# Patient Record
Sex: Female | Born: 1955 | Race: White | Hispanic: No | Marital: Married | Smoking: Never smoker
Health system: Southern US, Community
[De-identification: ages and names within clinical notes are randomized; demographics above are authoritative.]

## PROBLEM LIST (undated history)

## (undated) DIAGNOSIS — 1 ERRONEOUS ENCOUNTER ICD10: Secondary | ICD-10-CM

## (undated) HISTORY — PX: BREAST EXCISIONAL BIOPSY: SUR124

## (undated) HISTORY — PX: BREAST CYST ASPIRATION: SHX578

---

## 2010-08-17 ENCOUNTER — Ambulatory Visit: Payer: Self-pay | Admitting: Family Medicine

## 2010-08-25 ENCOUNTER — Ambulatory Visit: Payer: Self-pay | Admitting: Family Medicine

## 2012-01-21 ENCOUNTER — Emergency Department: Payer: Self-pay | Admitting: Emergency Medicine

## 2012-07-24 ENCOUNTER — Ambulatory Visit: Payer: Self-pay | Admitting: Family Medicine

## 2012-07-26 ENCOUNTER — Ambulatory Visit: Payer: Self-pay | Admitting: Family Medicine

## 2014-03-02 ENCOUNTER — Ambulatory Visit: Payer: Self-pay | Admitting: Internal Medicine

## 2014-03-02 LAB — URINALYSIS, COMPLETE
Bilirubin,UR: NEGATIVE
Glucose,UR: NEGATIVE
Ketone: NEGATIVE
NITRITE: NEGATIVE
PROTEIN: NEGATIVE
Ph: 6.5 (ref 5.0–8.0)
Specific Gravity: 1.005 (ref 1.000–1.030)
WBC UR: >30 /HPF (ref 0–5)

## 2014-03-04 LAB — URINE CULTURE

## 2014-09-19 DIAGNOSIS — L219 Seborrheic dermatitis, unspecified: Secondary | ICD-10-CM | POA: Insufficient documentation

## 2014-09-19 DIAGNOSIS — G5603 Carpal tunnel syndrome, bilateral upper limbs: Secondary | ICD-10-CM | POA: Insufficient documentation

## 2014-10-06 ENCOUNTER — Ambulatory Visit
Admit: 2014-10-06 | Disposition: A | Discharge status: Home / Self Care | Payer: Self-pay | Attending: Family Medicine | Admitting: Family Medicine

## 2014-10-29 DIAGNOSIS — I10 Essential (primary) hypertension: Secondary | ICD-10-CM | POA: Insufficient documentation

## 2015-12-06 ENCOUNTER — Emergency Department
Admission: EM | Admit: 2015-12-06 | Discharge: 2015-12-06 | Disposition: A | Discharge status: Home / Self Care | Payer: Managed Care, Other (non HMO) | Attending: Emergency Medicine | Admitting: Emergency Medicine

## 2015-12-06 ENCOUNTER — Emergency Department: Payer: Managed Care, Other (non HMO)

## 2015-12-06 DIAGNOSIS — J209 Acute bronchitis, unspecified: Secondary | ICD-10-CM | POA: Insufficient documentation

## 2015-12-06 DIAGNOSIS — R05 Cough: Secondary | ICD-10-CM | POA: Diagnosis present

## 2015-12-06 LAB — BASIC METABOLIC PANEL
Anion gap: 9 (ref 5–15)
BUN: 13 mg/dL (ref 6–20)
CALCIUM: 9.7 mg/dL (ref 8.9–10.3)
CHLORIDE: 105 mmol/L (ref 101–111)
CO2: 24 mmol/L (ref 22–32)
CREATININE: 0.86 mg/dL (ref 0.44–1.00)
GFR calc non Af Amer: >60 mL/min (ref >60)
GLUCOSE: 109 mg/dL — AB (ref 65–99)
Potassium: 3.7 mmol/L (ref 3.5–5.1)
Sodium: 138 mmol/L (ref 135–145)

## 2015-12-06 LAB — CBC
HCT: 37.8 % (ref 35.0–47.0)
Hemoglobin: 12.8 g/dL (ref 12.0–16.0)
MCH: 29.6 pg (ref 26.0–34.0)
MCHC: 33.8 g/dL (ref 32.0–36.0)
MCV: 87.5 fL (ref 80.0–100.0)
PLATELETS: 239 10*3/uL (ref 150–440)
RBC: 4.32 MIL/uL (ref 3.80–5.20)
RDW: 13.9 % (ref 11.5–14.5)
WBC: 8.5 10*3/uL (ref 3.6–11.0)

## 2015-12-06 LAB — TROPONIN I

## 2015-12-06 MED ORDER — ALBUTEROL SULFATE HFA 108 (90 BASE) MCG/ACT IN AERS: 2.0000 | INHALATION_SPRAY | Freq: Four times a day (QID) | RESPIRATORY_TRACT | Status: DC | PRN

## 2015-12-06 MED ORDER — BENZONATATE 100 MG PO CAPS: 100.0000 mg | ORAL_CAPSULE | Freq: Three times a day (TID) | ORAL | Status: AC | PRN

## 2015-12-06 NOTE — ED Provider Notes (Signed)
Time Seen: Approximately 1800 I have reviewed the triage notes  Chief Complaint: Cough and Chest Pain   History of Present Illness: Mariah Ali is a 60 y.o. female who presents with a one-week history of some intermittent nasal congestion, productive cough with some clear to occasional dark tinged sputum and some feelings of chest tightness. [place] and she isa symptoms seem to be worse whenever the central area is turned on. She states that no particular chest pain, she just feels like occasionally she can't quite get a full breath. She does smoke but otherwise denies any pulmonary emboli risk factors. She denies any leg pain or swelling. She denies any nausea, vomiting, diaphoresis. She denies any hemoptysis or wheezing at home.   No past medical history on file.  There are no active problems to display for this patient.   No past surgical history on file.  No past surgical history on file.  Current Outpatient Rx  Name  Route  Sig  Dispense  Refill  . albuterol (PROVENTIL HFA;VENTOLIN HFA) 108 (90 Base) MCG/ACT inhaler   Inhalation   Inhale 2 puffs into the lungs every 6 (six) hours as needed for wheezing or shortness of breath.   1 Inhaler   2   . benzonatate (TESSALON PERLES) 100 MG capsule   Oral   Take 1 capsule (100 mg total) by mouth 3 (three) times daily as needed for cough.   30 capsule   0     Allergies:  Codeine  Family History: No family history on file.  Social History: Social History  Substance Use Topics  . Smoking status: Not on file  . Smokeless tobacco: Not on file  . Alcohol Use: Not on file     Review of Systems:   10 point review of systems was performed and was otherwise negative:  Constitutional: No fever Eyes: No visual disturbances ENT: No sore throat, ear pain Cardiac: No  current chest pain Respiratory: No current shortness of breath, wheezing, or stridor Abdomen: No abdominal pain,  no vomiting, No diarrhea Endocrine: No weight loss, No night sweats Extremities: No peripheral edema, cyanosis Skin: No rashes, easy bruising Neurologic: No focal weakness, trouble with speech or swollowing Urologic: No dysuria, Hematuria, or urinary frequency   Physical Exam:  ED Triage Vitals  Enc Vitals Group     BP 12/06/15 1705 161/58 mmHg     Pulse Rate 12/06/15 1705 97     Resp 12/06/15 1705 20     Temp 12/06/15 1705 98 F (36.7 C)     Temp Source 12/06/15 1705 Oral     SpO2 12/06/15 1705 99 %     Weight 12/06/15 1705 134 lb (60.782 kg)     Height 12/06/15 1705  (1.676 m)     Head Cir --      Peak Flow --      Pain Score --      Pain Loc --      Pain Edu? --      Excl. in [place]? --     General: Awake , Alert , and Oriented times 3; GCS 15 Head: Normal cephalic , atraumatic Eyes: Pupils equal , round, reactive to light Nose/Throat: No nasal drainage, patent upper airway without erythema or exudate.  Neck: Supple, Full range of motion, No anterior adenopathy or palpable thyroid masses Lungs: Clear to ascultation without wheezes , rhonchi, or rales Heart: Regular rate, regular rhythm  without murmurs , gallops , or rubs Abdomen: Soft, non tender without rebound, guarding , or rigidity; bowel sounds positive and symmetric in all 4 quadrants. No organomegaly .        Extremities: 2 plus symmetric pulses. No edema, clubbing or cyanosis Neurologic: normal ambulation, Motor symmetric without deficits, sensory intact Skin: warm, dry, no rashes   Labs:   All laboratory work was reviewed including any pertinent negatives or positives listed below:  Labs Reviewed  BASIC METABOLIC PANEL - Abnormal; Notable for the following:    Glucose, Bld 109 (*)    All other components within normal limits  CBC  TROPONIN I  Laboratory work was reviewed and showed no clinically significant abnormalities.   EKG: ED ECG REPORT I, Jennye MoccasinBrian S Quigley, the attending physician, personally  viewed and interpreted this ECG.  Date: 12/06/2015 EKG Time: 1710  time Rate: 100 Rhythm: normal sinus rhythm QRS Axis: normal Intervals: normal ST/T Wave abnormalities: Nonspecific ST-T wave abnormality  Conduction Disturbances: none Narrative Interpretation: unremarkable *ischemic changes are noted   Radiology:   DG Chest 2 View (Final result) Result time: 12/06/15 18:12:15   Final result by Rad Results In Interface (12/06/15 18:12:15)   Narrative:   CLINICAL DATA: Cough and chest pain for 1 week.  EXAM: CHEST 2 VIEW  COMPARISON: None.  FINDINGS: The cardiomediastinal silhouette is unremarkable.  There is no evidence of focal airspace disease, pulmonary edema, suspicious pulmonary nodule/mass, pleural effusion, or pneumothorax. No acute bony abnormalities are identified.  IMPRESSION: No active cardiopulmonary disease.   Electronically Signed By: Harmon PierJeffrey Hu M.D. On: 12/06/2015 18:12      I personally reviewed the radiologic studies     ED Course:  Patient's stay here was uneventful and given her clinical presentation and objective findings I felt most likely this was acute bronchitis. I felt was unlikely was bacterial or viral given the lack of a fever for a consistently productive nature to her cough. I felt this may be environmental allergies and she was advised to get an over-the-counter antihistamine and use Afrin nasal spray occasionally for nasal congestion. I prescribed her Tessalon Perles and an inhaler. I felt this was unlikely to be a life-threatening causes of chest pain such as acute coronary syndrome, pulmonary embolism, etc.  Assessment: Acute bronchitis   Final Clinical Impression:   Final diagnoses:  Acute bronchitis, unspecified organism     Plan:  Outpatient Patient was advised to return immediately if condition worsens. Patient was advised to follow up with their primary care physician or other specialized physicians  involved in their outpatient care. The patient and/or family member/power of attorney had laboratory results reviewed at the bedside. All questions and concerns were addressed and appropriate discharge instructions were distributed by the nursing staff. **            Jennye MoccasinBrian S Quigley, MD 12/06/15 [id number]

## 2015-12-06 NOTE — ED Notes (Addendum)
Cough for past week, runny nose and chest tightness. Productive clear and brown sputum  States she felt nauseated today. Denies SOB. States she took bactrim today that she had left over for a UTI. States she feels anxious also.

## 2015-12-06 NOTE — Discharge Instructions (Signed)
Acute Bronchitis °Bronchitis is inflammation of the airways that extend from the windpipe into the lungs (bronchi). The inflammation often causes mucus to develop. This leads to a cough, which is the most common symptom of bronchitis.  °In acute bronchitis, the condition usually develops suddenly and goes away over time, usually in a couple weeks. Smoking, allergies, and asthma can make bronchitis worse. Repeated episodes of bronchitis may cause further lung problems.  °CAUSES °Acute bronchitis is most often caused by the same virus that causes a cold. The virus can spread from person to person (contagious) through coughing, sneezing, and touching contaminated objects. °SIGNS AND SYMPTOMS  °· Cough.   °· Fever.   °· Coughing up mucus.   °· Body aches.   °· Chest congestion.   °· Chills.   °· Shortness of breath.   °· Sore throat.   °DIAGNOSIS  °Acute bronchitis is usually diagnosed through a physical exam. Your health care provider will also ask you questions about your medical history. Tests, such as chest X-rays, are sometimes done to rule out other conditions.  °TREATMENT  °Acute bronchitis usually goes away in a couple weeks. Oftentimes, no medical treatment is necessary. Medicines are sometimes given for relief of fever or cough. Antibiotic medicines are usually not needed but may be prescribed in certain situations. In some cases, an inhaler may be recommended to help reduce shortness of breath and control the cough. A cool mist vaporizer may also be used to help thin bronchial secretions and make it easier to clear the chest.  °HOME CARE INSTRUCTIONS °· Get plenty of rest.   °· Drink enough fluids to keep your urine clear or pale yellow (unless you have a medical condition that requires fluid restriction). Increasing fluids may help thin your respiratory secretions (sputum) and reduce chest congestion, and it will prevent dehydration.   °· Take medicines only as directed by your health care provider. °· If  you were prescribed an antibiotic medicine, finish it all even if you start to feel better. °· Avoid smoking and secondhand smoke. Exposure to cigarette smoke or irritating chemicals will make bronchitis worse. If you are a smoker, consider using nicotine gum or skin patches to help control withdrawal symptoms. Quitting smoking will help your lungs heal faster.   °· Reduce the chances of another bout of acute bronchitis by washing your hands frequently, avoiding people with cold symptoms, and trying not to touch your hands to your mouth, nose, or eyes.   °· Keep all follow-up visits as directed by your health care provider.   °SEEK MEDICAL CARE IF: °Your symptoms do not improve after 1 week of treatment.  °SEEK IMMEDIATE MEDICAL CARE IF: °· You develop an increased fever or chills.   °· You have chest pain.   °· You have severe shortness of breath. °· You have bloody sputum.   °· You develop dehydration. °· You faint or repeatedly feel like you are going to pass out. °· You develop repeated vomiting. °· You develop a severe headache. °MAKE SURE YOU:  °· Understand these instructions. °· Will watch your condition. °· Will get help right away if you are not doing well or get worse. °  °This information is not intended to replace advice given to you by your health care provider. Make sure you discuss any questions you have with your health care provider. °  °Document Released: 07/28/2004 Document Revised: 07/11/2014 Document Reviewed: 12/11/2012 °Elsevier Interactive Patient Education ©2016 [employer] ° °Please return immediately if condition worsens. Please contact her primary physician or the physician you were given for   referral. If you have any specialist physicians involved in her treatment and plan please also contact them. Thank you for using [facility]. ° °

## 2016-10-10 ENCOUNTER — Other Ambulatory Visit: Payer: Self-pay | Admitting: Family Medicine

## 2016-10-10 DIAGNOSIS — Z1231 Encounter for screening mammogram for malignant neoplasm of breast: Secondary | ICD-10-CM

## 2016-11-04 ENCOUNTER — Ambulatory Visit
Admission: RE | Admit: 2016-11-04 | Discharge: 2016-11-04 | Disposition: A | Discharge status: Home / Self Care | Payer: 59 | Source: Ambulatory Visit | Attending: Family Medicine | Admitting: Family Medicine

## 2016-11-04 DIAGNOSIS — Z1231 Encounter for screening mammogram for malignant neoplasm of breast: Secondary | ICD-10-CM | POA: Diagnosis present

## 2016-11-09 DIAGNOSIS — R7303 Prediabetes: Secondary | ICD-10-CM | POA: Insufficient documentation

## 2016-11-09 DIAGNOSIS — E785 Hyperlipidemia, unspecified: Secondary | ICD-10-CM | POA: Insufficient documentation

## 2016-11-09 DIAGNOSIS — E038 Other specified hypothyroidism: Secondary | ICD-10-CM | POA: Insufficient documentation

## 2019-01-10 DIAGNOSIS — R202 Paresthesia of skin: Secondary | ICD-10-CM | POA: Insufficient documentation

## 2019-02-21 ENCOUNTER — Encounter

## 2019-02-21 NOTE — Progress Notes (Signed)
This encounter was created in error - please disregard.

## 2019-04-11 NOTE — Telephone Encounter (Signed)
Error

## 2019-07-29 ENCOUNTER — Other Ambulatory Visit: Payer: Self-pay | Admitting: Family Medicine

## 2019-07-29 DIAGNOSIS — Z1231 Encounter for screening mammogram for malignant neoplasm of breast: Secondary | ICD-10-CM

## 2019-08-13 DIAGNOSIS — R87619 Unspecified abnormal cytological findings in specimens from cervix uteri: Secondary | ICD-10-CM | POA: Insufficient documentation

## 2019-08-30 ENCOUNTER — Ambulatory Visit
Admission: RE | Admit: 2019-08-30 | Discharge: 2019-08-30 | Disposition: A | Discharge status: Home / Self Care | Payer: BLUE CROSS/BLUE SHIELD | Source: Ambulatory Visit | Attending: Family Medicine | Admitting: Family Medicine

## 2019-08-30 DIAGNOSIS — Z1231 Encounter for screening mammogram for malignant neoplasm of breast: Secondary | ICD-10-CM | POA: Insufficient documentation

## 2020-11-05 ENCOUNTER — Telehealth: Payer: Self-pay

## 2020-11-05 NOTE — Telephone Encounter (Signed)
[facility] received a referral from [facility] for a screening colonoscopy. Patient insurance is out of Network for [place]. Faxed the referral back to the office informing them of this information

## 2021-01-16 IMAGING — MG DIGITAL SCREENING BILAT W/ TOMO W/ CAD
6 of 10 series · 6 of 30 positions shown · non-contrast
Comparison: Previous exam(s).

CLINICAL DATA: Screening.

EXAM:
DIGITAL SCREENING BILATERAL MAMMOGRAM WITH TOMO AND CAD

[R CC synth-2D]
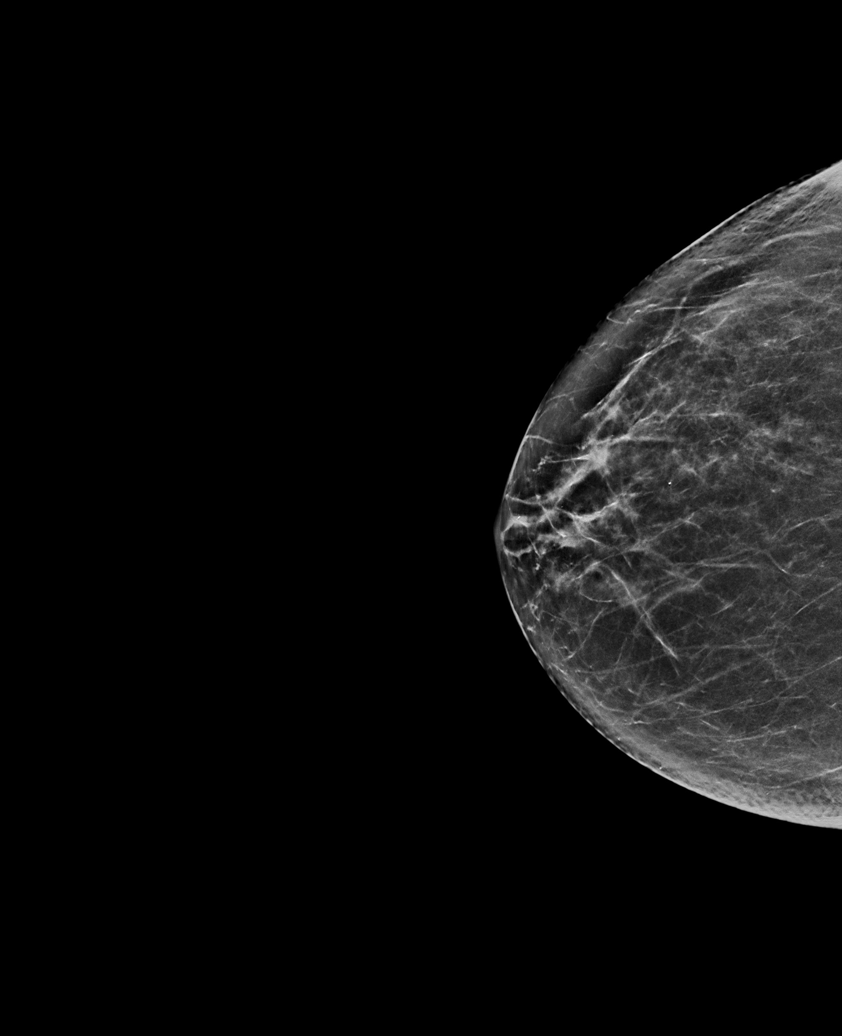

[L CC synth-2D]
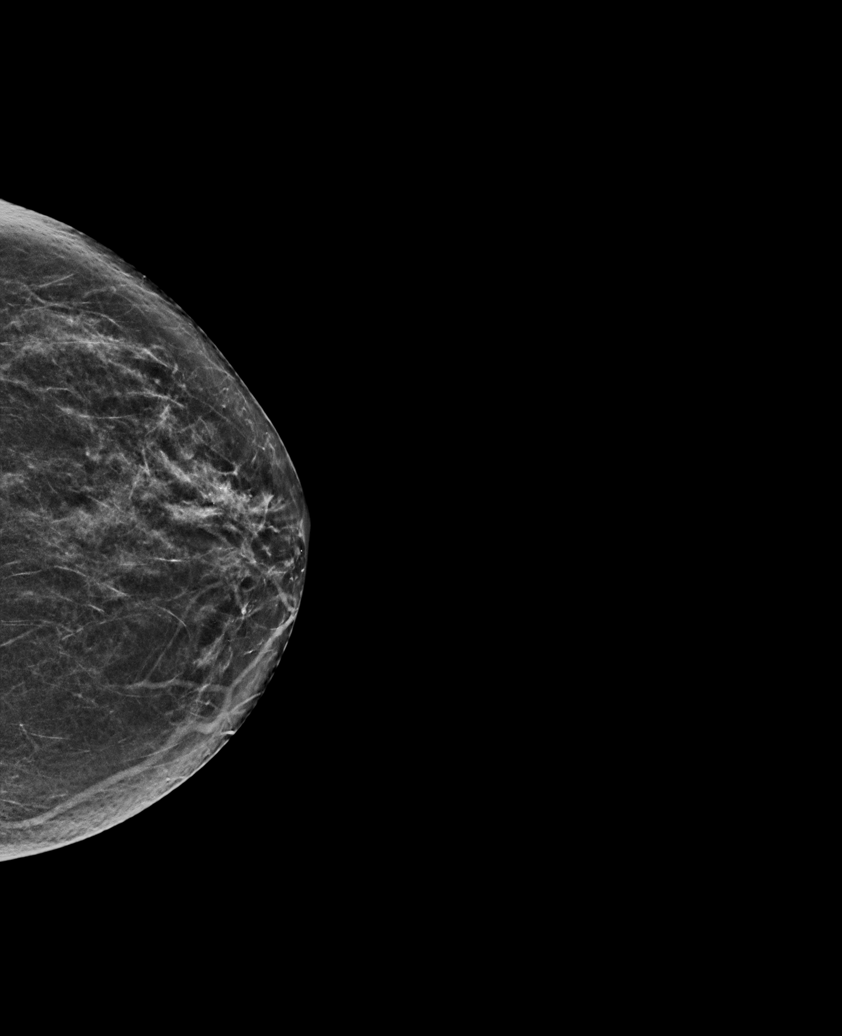

[L MLO synth-2D]
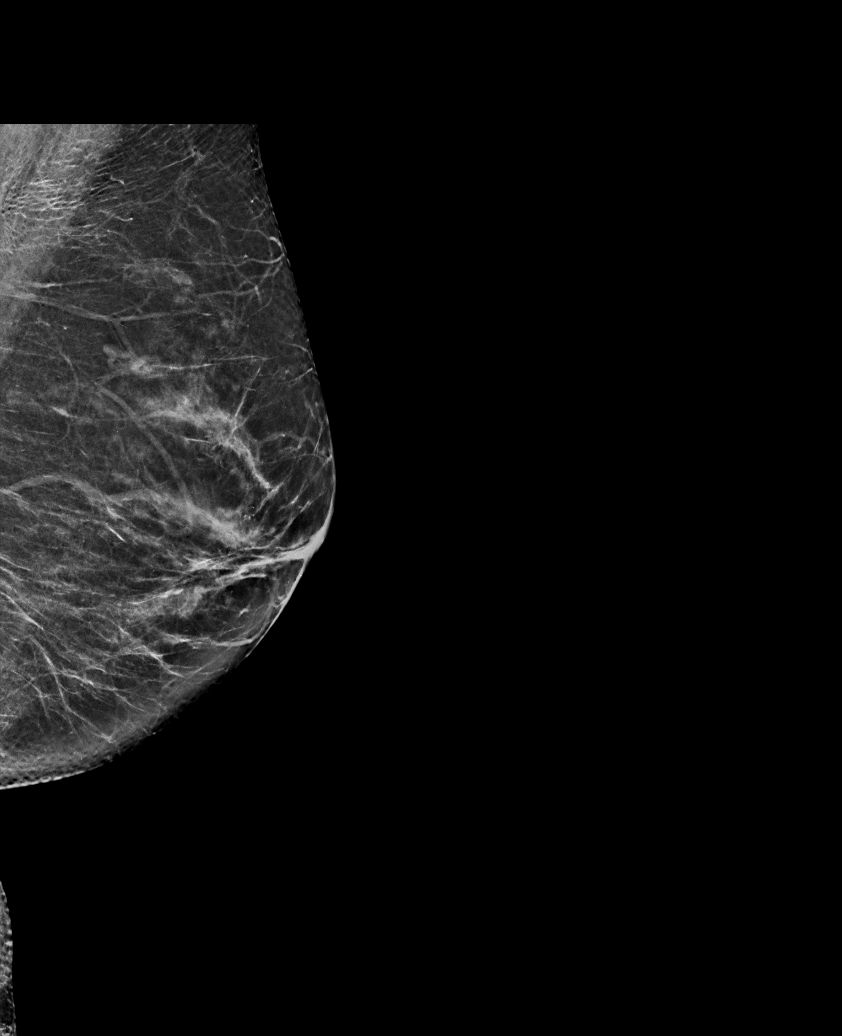

[R MLO synth-2D (1 of 2)]
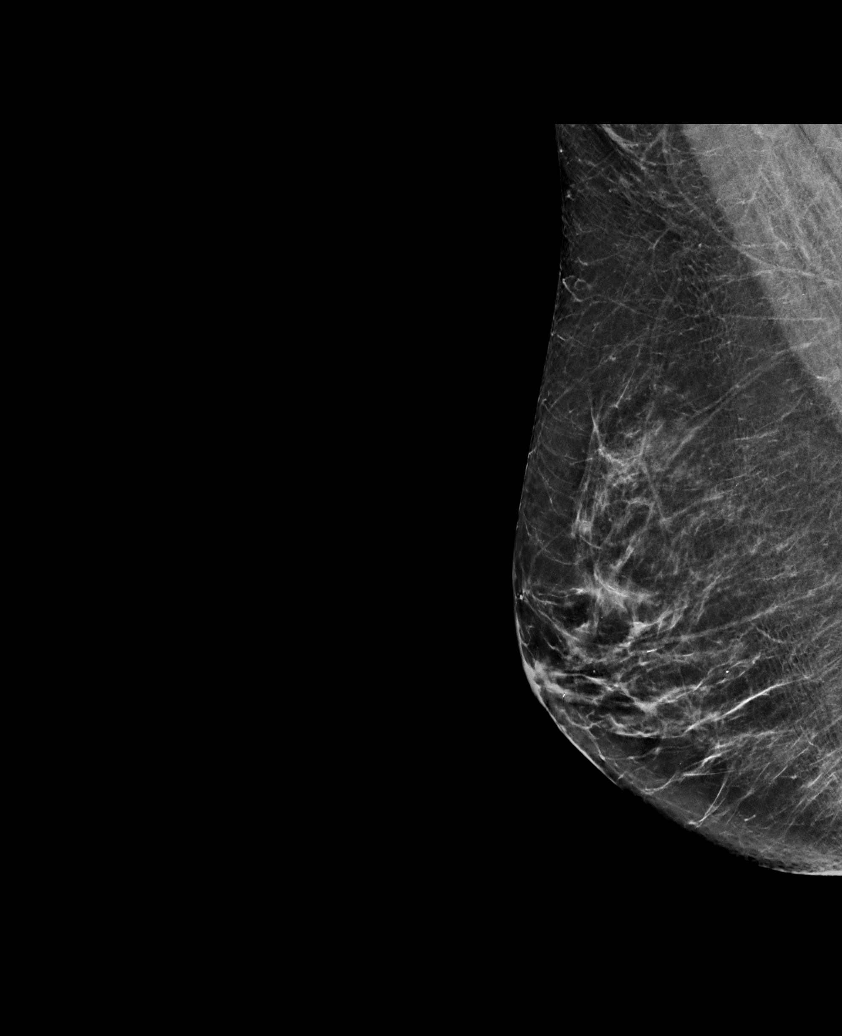

[R MLO synth-2D (2 of 2)]
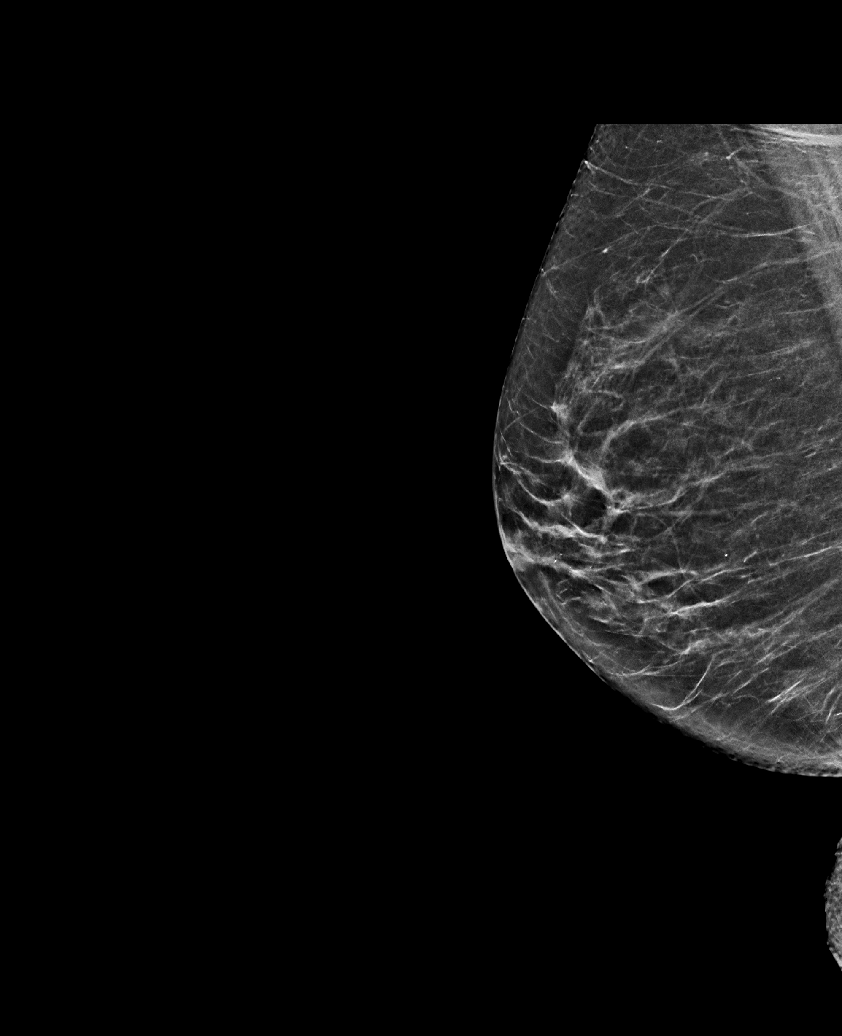

[R CC tomo · tomo slice 33/66.0]
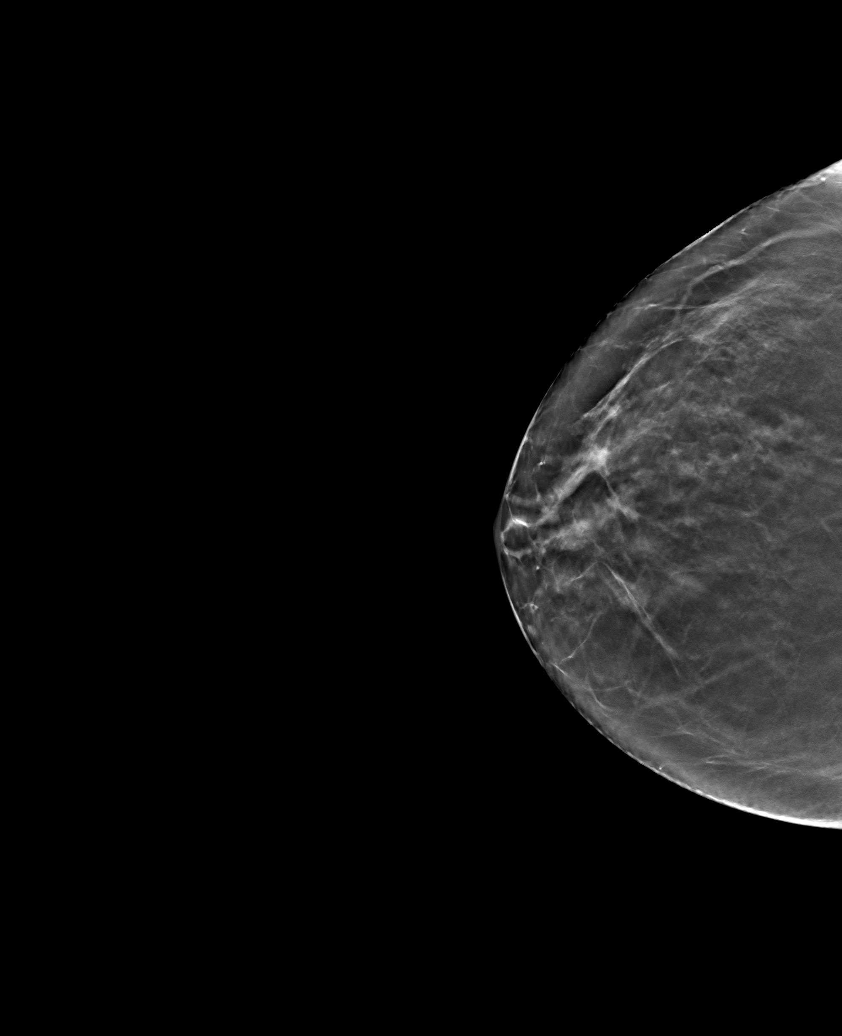

[6 of 30 positions shown; findings below may reference images not displayed]

ACR Breast Density Category b: There are scattered areas of
fibroglandular density.
FINDINGS: There are no findings suspicious for malignancy. Images were
processed with CAD.
IMPRESSION: No mammographic evidence of malignancy. A result letter of this
screening mammogram will be mailed directly to the patient.

RECOMMENDATION:
Screening mammogram in one year. (Code:[id number]-775)

BI-RADS CATEGORY  1: Negative.

## 2021-05-10 DIAGNOSIS — R002 Palpitations: Secondary | ICD-10-CM | POA: Insufficient documentation

## 2021-09-06 ENCOUNTER — Other Ambulatory Visit: Payer: Self-pay

## 2021-09-06 ENCOUNTER — Ambulatory Visit
Admission: EM | Admit: 2021-09-06 | Discharge: 2021-09-06 | Disposition: A | Discharge status: Home / Self Care | Payer: Commercial Managed Care - HMO

## 2021-09-06 DIAGNOSIS — B356 Tinea cruris: Secondary | ICD-10-CM

## 2021-09-06 MED ORDER — CLOTRIMAZOLE-BETAMETHASONE 1-0.05 % EX CREA: TOPICAL_CREAM | CUTANEOUS | 0 refills | Status: AC

## 2021-09-06 NOTE — ED Triage Notes (Signed)
Pt here with C/O rash from lower/coccyx to vagina for 3 days. Itches, no pain. ?

## 2021-09-06 NOTE — ED Provider Notes (Signed)
?[place] ? ? ? ?CSN: [id number] ?Arrival date & time: 09/06/21  1708 ? ? ?  ? ?History   ?Chief Complaint ?Chief Complaint  ?Patient presents with  ? Rash  ? ? ?HPI ?Mariah Ali is a 66 y.o. female.  ? ?HPI ? ?66 year old female here for evaluation of perineal rash and itching. ? ?Patient reports that she first developed rash and itching in the cleft of her buttocks that is now come forward to her vaginal area.  This been going on for last 3 days.  She states it itches and burns.  She denies any pain with urination, urinary urgency, or frequency.  She thought it might be a vaginal yeast infection so she took a 1 day yeast infection treatment but she denies any vaginal discharge. ? ?No past medical history on file. ? ?There are no problems to display for this patient. ? ? ?Past Surgical History:  ?Procedure Laterality Date  ? BREAST CYST ASPIRATION    ? BREAST EXCISIONAL BIOPSY Left   ? in early 20's, removal of small tumor  ? ? ?OB History   ?No obstetric history on file. ?  ? ? ? ?Home Medications   ? ?Prior to Admission medications   ?Medication Sig Start Date End Date Taking? Authorizing Provider  ?clotrimazole-betamethasone (LOTRISONE) cream Apply to affected area 2 times daily prn 09/06/21  Yes Margarette Canada, NP  ?lisinopril (ZESTRIL) 2.5 MG tablet Take 2.5 mg by mouth daily. 09/04/21  Yes [provider]  ?simvastatin (ZOCOR) 10 MG tablet Take 10 mg by mouth daily.   Yes [provider]  ? ? ?Family History ?Family History  ?Problem Relation Age of Onset  ? Breast cancer Mother 96  ? Breast cancer Sister 75  ? Breast cancer Sister 68  ? ? ?Social History ?Social History  ? ?Tobacco Use  ? Smoking status: Never  ? Smokeless tobacco: Never  ? ? ? ?Allergies   ?Codeine ? ? ?Review of Systems ?Review of Systems  ?Genitourinary:  Negative for vaginal discharge and vaginal pain.  ?Skin:  Positive for color change and rash.  ?Hematological: Negative.   ?Psychiatric/Behavioral: Negative.     ? ? ?Physical Exam ?Triage Vital Signs ?ED Triage Vitals  ?Enc Vitals Group  ?   BP 09/06/21 1732 120/90  ?   Pulse Rate 09/06/21 1732 90  ?   Resp 09/06/21 1732 18  ?   Temp 09/06/21 1732 98.8 ?F (37.1 ?C)  ?   Temp Source 09/06/21 1732 Oral  ?   SpO2 09/06/21 1732 100 %  ?   Weight 09/06/21 1731 120 lb (54.4 kg)  ?   Height 09/06/21 1731 5\' 6"  (1.676 m)  ?   Head Circumference --   ?   Peak Flow --   ?   Pain Score 09/06/21 1731 0  ?   Pain Loc --   ?   Pain Edu? --   ?   Excl. in [place]? --   ? ?No data found. ? ?Updated Vital Signs ?BP 120/90 (BP Location: Left Arm)   Pulse 90   Temp 98.8 ?F (37.1 ?C) (Oral)   Resp 18   Ht 5\' 6"  (1.676 m)   Wt 120 lb (54.4 kg)   SpO2 100%   BMI 19.37 kg/m?  ? ?Visual Acuity ?Right Eye Distance:   ?Left Eye Distance:   ?Bilateral Distance:   ? ?Right Eye Near:   ?Left Eye Near:    ?Bilateral Near:    ? ?  Physical Exam ?Vitals and nursing note reviewed. Exam conducted with a chaperone present Steffanie Dunn, CMA).  ?Constitutional:   ?   Appearance: Normal appearance. She is not ill-appearing.  ?HENT:  ?   Head: Normocephalic and atraumatic.  ?Skin: ?   General: Skin is warm.  ?   Capillary Refill: Capillary refill takes less than 2 seconds.  ?   Findings: Erythema and rash present.  ?Neurological:  ?   General: No focal deficit present.  ?   Mental Status: She is alert and oriented to person, place, and time.  ?Psychiatric:     ?   Mood and Affect: Mood normal.     ?   Behavior: Behavior normal.     ?   Thought Content: Thought content normal.     ?   Judgment: Judgment normal.  ? ? ? ?UC Treatments / Results  ?Labs ?(all labs ordered are listed, but only abnormal results are displayed) ?Labs Reviewed - No data to display ? ? ?EKG ? ? ?Radiology ?No results found. ? ?Procedures ?Procedures (including critical care time) ? ?Medications Ordered in UC ?Medications - No data to display ? ?Initial Impression / Assessment and Plan / UC Course  ?I have reviewed the triage vital signs and  the nursing notes. ? ?Pertinent labs & imaging results that were available during my care of the patient were reviewed by me and considered in my medical decision making (see chart for details). ? ?Patient is a nontoxic-appearing 66 year old female here for evaluation of perineal rash that started at her gluteal cleft 3 days ago and is still present.  Her largest complaint is that it itches and is burning.  With a chaperone at bedside I examined the patient's perineal region and there is erythema in the gluteal cleft that extends around through the perineal region and is on both labia majora externally.  The rash is blanchable and erythematous.  Patient Damas consistent with a skin yeast infection.  I will treat patient with clotrimazole twice daily until these test resolved and then she can continue it for 2 to 3 days beyond that to make sure that her symptoms do not relapse. ? ? ?Final Clinical Impressions(s) / UC Diagnoses  ? ?Final diagnoses:  ?Tinea cruris  ? ? ? ?Discharge Instructions   ? ?  ?Apply the Lotrisone cream to the rash on your perineum twice daily until the rash has resolved and then for an additional 2 to 3 days to make sure the symptoms do not return. ? ?If your symptoms do not improve, or they worsen, either return for reevaluation or see dermatology. ? ? ? ? ?ED Prescriptions   ? ? Medication Sig Dispense Auth. Provider  ? clotrimazole-betamethasone (LOTRISONE) cream Apply to affected area 2 times daily prn 45 g Margarette Canada, NP  ? ?  ? ?PDMP not reviewed this encounter. ?  ?Margarette Canada, NP ?09/06/21 1802 ? ?

## 2021-09-06 NOTE — Discharge Instructions (Addendum)
Apply the Lotrisone cream to the rash on your perineum twice daily until the rash has resolved and then for an additional 2 to 3 days to make sure the symptoms do not return. ? ?If your symptoms do not improve, or they worsen, either return for reevaluation or see dermatology. ?

## 2021-11-19 ENCOUNTER — Other Ambulatory Visit: Payer: Self-pay | Admitting: Family Medicine

## 2021-11-19 DIAGNOSIS — Z1231 Encounter for screening mammogram for malignant neoplasm of breast: Secondary | ICD-10-CM

## 2021-12-17 ENCOUNTER — Ambulatory Visit
Admission: RE | Admit: 2021-12-17 | Discharge: 2021-12-17 | Disposition: A | Discharge status: Home / Self Care | Payer: Medicare Other | Source: Ambulatory Visit | Attending: Family Medicine | Admitting: Family Medicine

## 2021-12-17 DIAGNOSIS — Z1231 Encounter for screening mammogram for malignant neoplasm of breast: Secondary | ICD-10-CM | POA: Insufficient documentation

## 2023-05-24 DIAGNOSIS — D099 Carcinoma in situ, unspecified: Secondary | ICD-10-CM

## 2023-05-24 HISTORY — DX: Carcinoma in situ, unspecified: D09.9

## 2023-05-25 ENCOUNTER — Ambulatory Visit: Payer: Medicare Other | Admitting: Dermatology

## 2023-05-25 ENCOUNTER — Encounter: Payer: Self-pay | Admitting: Dermatology

## 2023-05-25 DIAGNOSIS — Z808 Family history of malignant neoplasm of other organs or systems: Secondary | ICD-10-CM

## 2023-05-25 DIAGNOSIS — D492 Neoplasm of unspecified behavior of bone, soft tissue, and skin: Secondary | ICD-10-CM

## 2023-05-25 DIAGNOSIS — D0461 Carcinoma in situ of skin of right upper limb, including shoulder: Secondary | ICD-10-CM

## 2023-05-25 DIAGNOSIS — L821 Other seborrheic keratosis: Secondary | ICD-10-CM | POA: Diagnosis not present

## 2023-05-25 DIAGNOSIS — C4492 Squamous cell carcinoma of skin, unspecified: Secondary | ICD-10-CM

## 2023-05-25 HISTORY — DX: Squamous cell carcinoma of skin, unspecified: C44.92

## 2023-05-25 NOTE — Progress Notes (Signed)
   New Patient Visit   Subjective  Mariah Ali is a 67 y.o. female who presents for the following: check spots R wrist ~5-55yrs, no symptoms has tried otc wart treatment, L arm not sure how long they have been there no symptoms, no hx of skin cancer, Aunts with hx of skin cancer The patient has spots, moles and lesions to be evaluated, some may be new or changing and the patient may have concern these could be cancer.  New patient referral from [facility].  Patient accompanied by husband who contributes to history.  The following portions of the chart were reviewed this encounter and updated as appropriate: medications, allergies, medical history  Review of Systems:  No other skin or systemic complaints except as noted in HPI or Assessment and Plan.  Objective  Well appearing patient in no apparent distress; mood and affect are within normal limits.   A focused examination was performed of the following areas: Arms, arms, back  Relevant exam findings are noted in the Assessment and Plan.  R dorsal wrist Keratotic pap 9.20mm       Assessment & Plan   FAMILY HISTORY OF SKIN CANCER What type(s):not sure Who affected:2 Aunts   Neoplasm of skin R dorsal wrist  Skin / nail biopsy Type of biopsy: tangential   Informed consent: discussed and consent obtained   Timeout: patient name, date of birth, surgical site, and procedure verified   Procedure prep:  Patient was prepped and draped in usual sterile fashion Prep type:  Isopropyl alcohol Anesthesia: the lesion was anesthetized in a standard fashion   Anesthetic:  1% lidocaine w/ epinephrine 1-100,000 buffered w/ 8.4% NaHCO3 Instrument used: DermaBlade   Hemostasis achieved with: pressure and aluminum chloride   Outcome: patient tolerated procedure well   Post-procedure details: sterile dressing applied and wound care instructions given   Dressing type: bandage and bacitracin    Specimen 1 - Surgical  pathology Differential Diagnosis: D48.5 R/O SCC  Check Margins: No Keratotic pap 9.9mm  SEBORRHEIC KERATOSIS arms - Stuck-on, waxy, tan-brown papules and/or plaques  - Benign-appearing - Discussed benign etiology and prognosis. - Observe - Call for any changes      Return for prn pending bx results.  I, Ardis Rowan, RMA, am acting as scribe for Elie Goody, MD .   Documentation: I have reviewed the above documentation for accuracy and completeness, and I agree with the above.  Elie Goody, MD

## 2023-05-25 NOTE — Patient Instructions (Addendum)

## 2023-05-30 LAB — SURGICAL PATHOLOGY

## 2023-05-31 ENCOUNTER — Telehealth: Payer: Self-pay

## 2023-05-31 NOTE — Telephone Encounter (Signed)
-----   Message from [place] sent at 05/30/2023  5:29 PM EST ----- Diagnosis R dorsal wrist :       SQUAMOUS CELL CARCINOMA IN SITU, ASSOCIATED WITH VERRUCA   Please call with diagnosis and message me with patient's decision on treatment. Please also schedule TBSE   Explanation: This is a squamous cell skin cancer limited to the top layer of skin. This means it is an early cancer and has not spread. However, it has the potential to spread beyond the skin and threaten your health, so we recommend treating it.   Treatment option 1: a cream (fluorouracil and calcipotriene) that helps your immune system clear the skin cancer. It will cause redness and irritation. Wait two weeks after the biopsy to start applying the cream. Apply the cream twice per day until the redness and irritation develop (usually occurs by day 7), then stop and allow it to heal. We will recheck the area in 2 months to ensure the cancer is gone. The cream is $35 plus shipping and will be mailed to you from a low cost compounding pharmacy. If the skin cancer comes back, we would need to do a surgery to remove it.  Treatment option 2: you return for a brief appointment where I perform electrodesiccation and curettage [facility]). This involves three rounds of scraping and burning to destroy the skin cancer. It has about an 85% cure rate and leaves a round wound slightly larger than the skin cancer and leaves a round white scar. No additional pathology is done. If the skin cancer comes back, we would need to do a surgery to remove it.

## 2023-05-31 NOTE — Telephone Encounter (Signed)
Patient advised pathology results showed SCCis, scheduled for TBSE and EDC to treat 06/13/23. Butch Penny., RMA

## 2023-06-13 ENCOUNTER — Encounter: Payer: Self-pay | Admitting: Dermatology

## 2023-06-13 ENCOUNTER — Ambulatory Visit: Payer: Medicare Other | Admitting: Dermatology

## 2023-06-13 DIAGNOSIS — L814 Other melanin hyperpigmentation: Secondary | ICD-10-CM

## 2023-06-13 DIAGNOSIS — D1801 Hemangioma of skin and subcutaneous tissue: Secondary | ICD-10-CM

## 2023-06-13 DIAGNOSIS — Z1283 Encounter for screening for malignant neoplasm of skin: Secondary | ICD-10-CM | POA: Diagnosis not present

## 2023-06-13 DIAGNOSIS — L821 Other seborrheic keratosis: Secondary | ICD-10-CM

## 2023-06-13 DIAGNOSIS — W908XXA Exposure to other nonionizing radiation, initial encounter: Secondary | ICD-10-CM

## 2023-06-13 DIAGNOSIS — D0461 Carcinoma in situ of skin of right upper limb, including shoulder: Secondary | ICD-10-CM

## 2023-06-13 DIAGNOSIS — L578 Other skin changes due to chronic exposure to nonionizing radiation: Secondary | ICD-10-CM | POA: Diagnosis not present

## 2023-06-13 DIAGNOSIS — D099 Carcinoma in situ, unspecified: Secondary | ICD-10-CM

## 2023-06-13 DIAGNOSIS — D229 Melanocytic nevi, unspecified: Secondary | ICD-10-CM

## 2023-06-13 NOTE — Progress Notes (Signed)
   Follow-Up Visit   Subjective  Mariah Ali is a 67 y.o. female who presents for the following: Skin Cancer Screening and Full Body Skin Exam, SCC IS bx proven R dorsal wrist, pt presents for [facility] today  The patient presents for Total-Body Skin Exam (TBSE) for skin cancer screening and mole check. The patient has spots, moles and lesions to be evaluated, some may be new or changing and the patient may have concern these could be cancer.    The following portions of the chart were reviewed this encounter and updated as appropriate: medications, allergies, medical history  Review of Systems:  No other skin or systemic complaints except as noted in HPI or Assessment and Plan.  Objective  Well appearing patient in no apparent distress; mood and affect are within normal limits.  A full examination was performed including scalp, head, eyes, ears, nose, lips, neck, chest, axillae, abdomen, back, buttocks, bilateral upper extremities, bilateral lower extremities, hands, feet, fingers, toes, fingernails, and toenails. All findings within normal limits unless otherwise noted below.   Relevant physical exam findings are noted in the Assessment and Plan.  R dorsal wrist Pink bx site    Assessment & Plan   SKIN CANCER SCREENING PERFORMED TODAY.  ACTINIC DAMAGE - Chronic condition, secondary to cumulative UV/sun exposure - diffuse scaly erythematous macules with underlying dyspigmentation - Recommend daily broad spectrum sunscreen SPF 30+ to sun-exposed areas, reapply every 2 hours as needed.  - Staying in the shade or wearing long sleeves, sun glasses (UVA+UVB protection) and wide brim hats (4-inch brim around the entire circumference of the hat) are also recommended for sun protection.  - Call for new or changing lesions.  LENTIGINES, SEBORRHEIC KERATOSES, HEMANGIOMAS - Benign normal skin lesions - Benign-appearing - Call for any changes  MELANOCYTIC NEVI - Tan-brown and/or  pink-flesh-colored symmetric macules and papules - Benign appearing on exam today - Observation - Call clinic for new or changing moles - Recommend daily use of broad spectrum spf 30+ sunscreen to sun-exposed areas.   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  Destruction of lesion Complexity: extensive   Destruction method: electrodesiccation and curettage   Informed consent: discussed and consent obtained   Timeout:  patient name, date of birth, surgical site, and procedure verified Procedure prep:  Patient was prepped and draped in usual sterile fashion Prep type:  Isopropyl alcohol Anesthesia: the lesion was anesthetized in a standard fashion   Anesthetic:  1% lidocaine w/ epinephrine 1-100,000 buffered w/ 8.4% NaHCO3 Curettage performed in three different directions: Yes   Electrodesiccation performed over the curetted area: Yes   Final wound size (cm):  1 Hemostasis achieved with:  pressure and electrodesiccation Outcome: patient tolerated procedure well with no complications   Post-procedure details: sterile dressing applied and wound care instructions given   Dressing type: bandage and bacitracin    Bx proven, EDC today   Return in about 6 months (around 12/12/2023) for TBSE, Hx of SCC IS.  I, Sonya Hupman, RMA, am acting as scribe for Elie Goody, MD .   Documentation: I have reviewed the above documentation for accuracy and completeness, and I agree with the above.  Elie Goody, MD

## 2023-06-13 NOTE — Patient Instructions (Addendum)

## 2023-10-10 ENCOUNTER — Other Ambulatory Visit: Payer: Self-pay | Admitting: Family Medicine

## 2023-10-10 DIAGNOSIS — Z1231 Encounter for screening mammogram for malignant neoplasm of breast: Secondary | ICD-10-CM

## 2023-10-17 ENCOUNTER — Ambulatory Visit
Admission: RE | Admit: 2023-10-17 | Discharge: 2023-10-17 | Disposition: A | Discharge status: Home / Self Care | Source: Ambulatory Visit | Attending: Family Medicine | Admitting: Family Medicine

## 2023-10-17 DIAGNOSIS — Z1231 Encounter for screening mammogram for malignant neoplasm of breast: Secondary | ICD-10-CM | POA: Insufficient documentation

## 2023-12-05 ENCOUNTER — Encounter: Payer: Self-pay | Admitting: Dermatology

## 2023-12-05 ENCOUNTER — Ambulatory Visit: Payer: Medicare Other | Admitting: Dermatology

## 2023-12-05 DIAGNOSIS — L719 Rosacea, unspecified: Secondary | ICD-10-CM

## 2023-12-05 DIAGNOSIS — L578 Other skin changes due to chronic exposure to nonionizing radiation: Secondary | ICD-10-CM | POA: Diagnosis not present

## 2023-12-05 DIAGNOSIS — D229 Melanocytic nevi, unspecified: Secondary | ICD-10-CM

## 2023-12-05 DIAGNOSIS — W908XXA Exposure to other nonionizing radiation, initial encounter: Secondary | ICD-10-CM | POA: Diagnosis not present

## 2023-12-05 DIAGNOSIS — L814 Other melanin hyperpigmentation: Secondary | ICD-10-CM

## 2023-12-05 DIAGNOSIS — L821 Other seborrheic keratosis: Secondary | ICD-10-CM

## 2023-12-05 DIAGNOSIS — Z808 Family history of malignant neoplasm of other organs or systems: Secondary | ICD-10-CM

## 2023-12-05 DIAGNOSIS — Z1283 Encounter for screening for malignant neoplasm of skin: Secondary | ICD-10-CM

## 2023-12-05 DIAGNOSIS — Z85828 Personal history of other malignant neoplasm of skin: Secondary | ICD-10-CM

## 2023-12-05 DIAGNOSIS — D1801 Hemangioma of skin and subcutaneous tissue: Secondary | ICD-10-CM

## 2023-12-05 NOTE — Patient Instructions (Signed)

## 2023-12-05 NOTE — Progress Notes (Signed)
 Follow-Up Visit   Subjective  Mariah Ali is a 68 y.o. female who presents for the following: Skin Cancer Screening and Full Body Skin Exam. Hx SCC. Fhx skin cancer.   The patient presents for Total-Body Skin Exam (TBSE) for skin cancer screening and mole check. The patient has spots, moles and lesions to be evaluated, some may be new or changing and the patient may have concern these could be cancer.    The following portions of the chart were reviewed this encounter and updated as appropriate: medications, allergies, medical history  Review of Systems:  No other skin or systemic complaints except as noted in HPI or Assessment and Plan.  Objective  Well appearing patient in no apparent distress; mood and affect are within normal limits.  A full examination was performed including scalp, head, eyes, ears, nose, lips, neck, chest, axillae, abdomen, back, buttocks, bilateral upper extremities, bilateral lower extremities, hands, feet, fingers, toes, fingernails, and toenails. All findings within normal limits unless otherwise noted below.   Relevant physical exam findings are noted in the Assessment and Plan.    Assessment & Plan   SKIN CANCER SCREENING PERFORMED TODAY.  ACTINIC DAMAGE - Chronic condition, secondary to cumulative UV/sun exposure - diffuse scaly erythematous macules with underlying dyspigmentation - Recommend daily broad spectrum sunscreen SPF 30+ to sun-exposed areas, reapply every 2 hours as needed.  - Staying in the shade or wearing long sleeves, sun glasses (UVA+UVB protection) and wide brim hats (4-inch brim around the entire circumference of the hat) are also recommended for sun protection.  - Call for new or changing lesions.  LENTIGINES, SEBORRHEIC KERATOSES, HEMANGIOMAS - Benign normal skin lesions - Benign-appearing - Call for any changes  MELANOCYTIC NEVI - Tan-brown and/or pink-flesh-colored symmetric macules and papules - Benign appearing on  exam today - Observation - Call clinic for new or changing moles - Recommend daily use of broad spectrum spf 30+ sunscreen to sun-exposed areas.   HISTORY OF SQUAMOUS CELL CARCINOMA OF THE SKIN - No evidence of recurrence today at right dorsal wrist, tx'd with [facility]/2023 - No lymphadenopathy - Recommend regular full body skin exams - Recommend daily broad spectrum sunscreen SPF 30+ to sun-exposed areas, reapply every 2 hours as needed.  - Call if any new or changing lesions are noted between office visits  FAMILY HISTORY OF SKIN CANCER What type(s): not sure Who affected: 2 aunts  ROSACEA Exam Mid face erythema with telangiectasias, mild  Chronic and persistent condition with duration or expected duration over one year. Condition is symptomatic / bothersome to patient. Not to goal.  Rosacea is a chronic progressive skin condition usually affecting the face of adults, causing redness and/or acne bumps. It is treatable but not curable. It sometimes affects the eyes (ocular rosacea) as well. It may respond to topical and/or systemic medication and can flare with stress, sun exposure, alcohol, exercise, topical steroids (including hydrocortisone/cortisone 10) and some foods.  Daily application of broad spectrum spf 30+ sunscreen to face is recommended to reduce flares.   Treatment Plan sunscreen daily  MULTIPLE BENIGN NEVI   LENTIGINES   ACTINIC ELASTOSIS   SEBORRHEIC KERATOSES   CHERRY ANGIOMA   ROSACEA   Return in about 6 months (around 06/05/2024) for TBSE, with Dr. Felipe Horton, [facility].  Kerstin Peeling, RMA, am acting as scribe for Harris Liming, MD .   Documentation: I have reviewed the above documentation for accuracy and completeness, and I agree with the above.  Harris Liming, MD

## 2023-12-12 ENCOUNTER — Ambulatory Visit: Payer: Medicare Other | Admitting: Dermatology

## 2024-03-21 ENCOUNTER — Encounter: Payer: Self-pay | Admitting: *Deleted

## 2024-04-02 ENCOUNTER — Telehealth: Payer: Self-pay

## 2024-04-02 DIAGNOSIS — F17210 Nicotine dependence, cigarettes, uncomplicated: Secondary | ICD-10-CM

## 2024-04-02 DIAGNOSIS — Z87891 Personal history of nicotine dependence: Secondary | ICD-10-CM

## 2024-04-02 DIAGNOSIS — Z122 Encounter for screening for malignant neoplasm of respiratory organs: Secondary | ICD-10-CM

## 2024-04-02 NOTE — Telephone Encounter (Signed)
 Lung Cancer Screening Narrative/Criteria Questionnaire (Cigarette Smokers Only- No Cigars/Pipes/vapes)   Mariah Ali   SDMV:04/12/2024 at 1:30 pm [name]       19-Mar-1956               LDCT: 04/16/2024 at 10:00 am [name]    68 y.o.   Phone: [phone]   Lung Screening Narrative (confirm age 80-77 yrs Medicare / 50-80 yrs Private pay insurance)   Insurance information: BCBS   Referring Provider: Lorel, MD   This screening involves an initial phone call with a team member from our program. It is called a shared decision making visit. The initial meeting is required by  insurance and Medicare to make sure you understand the program. This appointment takes about 15-20 minutes to complete. You will complete the screening scan at your scheduled date/time.  This scan takes about 5-10 minutes to complete. You can eat and drink normally before and after the scan.  Criteria questions for Lung Cancer Screening:   Are you a current or former smoker? Current Age began smoking: 16   If you are a former smoker, what year did you quit smoking? Never quit (within 15 yrs)   To calculate your smoking history, I need an accurate estimate of how many packs of cigarettes you smoked per day and for how many years. (Not just the number of PPD you are now smoking)   Years smoking 52 x Packs per day 1.5 = Pack years 78   (at least 20 pack yrs)   (Make sure they understand that we need to know how much they have smoked in the past, not just the number of PPD they are smoking now)  Do you have a personal history of cancer?  Yes - (type and when diagnosed - 5 yrs cancer free) Skin cancer all removed    Do you have a family history of cancer? Yes  (cancer type and and relative) Mother and sisters with breast cancer.   Are you coughing up blood?  No  Have you had unexplained weight loss of 15 lbs or more in the last 6 months? No  It looks like you meet all criteria.  When would be a good time for  us  to schedule you for this screening?   Additional information: N/A

## 2024-04-12 ENCOUNTER — Ambulatory Visit: Admitting: *Deleted

## 2024-04-12 ENCOUNTER — Encounter: Payer: Self-pay | Admitting: *Deleted

## 2024-04-12 DIAGNOSIS — F17210 Nicotine dependence, cigarettes, uncomplicated: Secondary | ICD-10-CM | POA: Diagnosis not present

## 2024-04-12 NOTE — Patient Instructions (Signed)

## 2024-04-12 NOTE — Progress Notes (Signed)
 Virtual Visit via Video Note  I connected with Mariah Ali on 04/12/24 at  1:30 PM EDT by a video enabled telemedicine application and verified that I am speaking with the correct person using two identifiers.  Location: Patient: in home Provider: [address], [place], [place], [address]    Shared Decision Making Visit Lung Cancer Screening Program [phone])   Eligibility: Age 68 y.o. Pack Years Smoking History Calculation 42 (# packs/per year x # years smoked) Recent History of coughing up blood  no Unexplained weight loss? no ( >Than 15 pounds within the last 6 months ) Prior History Lung / other cancer yes- skin cancer removed (Diagnosis within the last 5 years already requiring surveillance chest CT Scans). Smoking Status Current Smoker Former Smokers: Years since quit:  NA  Quit Date: NA  Visit Components: Discussion included one or more decision making aids. yes Discussion included risk/benefits of screening. yes Discussion included potential follow up diagnostic testing for abnormal scans. yes Discussion included meaning and risk of over diagnosis. yes Discussion included meaning and risk of False Positives. yes Discussion included meaning of total radiation exposure. yes  Counseling Included: Importance of adherence to annual lung cancer LDCT screening. yes Impact of comorbidities on ability to participate in the program. yes Ability and willingness to under diagnostic treatment. yes  Smoking Cessation Counseling: Current Smokers:  Discussed importance of smoking cessation. yes Information about tobacco cessation classes and interventions provided to patient. yes Patient provided with ticket for LDCT Scan. yes Symptomatic Patient. no  Counseling NA Diagnosis Code: Tobacco Use Z72.0 Asymptomatic Patient yes  Counseling (Intermediate counseling: > three minutes counseling) H9563  Counseled patient 4 minutes regarding tobacco use.   Former  Smokers:  Discussed the importance of maintaining cigarette abstinence. yes Diagnosis Code: Personal History of Nicotine Dependence. S12.108 Information about tobacco cessation classes and interventions provided to patient. Yes Patient provided with ticket for LDCT Scan. yes Written Order for Lung Cancer Screening with LDCT placed in Epic. Yes (CT Chest Lung Cancer Screening Low Dose W/O CM) [id number] Z12.2-Screening of respiratory organs Z87.891-Personal history of nicotine dependence   Josette Ranger, RN 04/12/24

## 2024-04-16 ENCOUNTER — Ambulatory Visit
Admission: RE | Admit: 2024-04-16 | Discharge: 2024-04-16 | Disposition: A | Discharge status: Home / Self Care | Source: Ambulatory Visit | Attending: Acute Care | Admitting: Acute Care

## 2024-04-16 DIAGNOSIS — Z87891 Personal history of nicotine dependence: Secondary | ICD-10-CM | POA: Insufficient documentation

## 2024-04-16 DIAGNOSIS — Z122 Encounter for screening for malignant neoplasm of respiratory organs: Secondary | ICD-10-CM | POA: Diagnosis present

## 2024-04-16 DIAGNOSIS — F17210 Nicotine dependence, cigarettes, uncomplicated: Secondary | ICD-10-CM | POA: Insufficient documentation

## 2024-04-22 ENCOUNTER — Telehealth: Payer: Self-pay

## 2024-04-22 ENCOUNTER — Other Ambulatory Visit: Payer: Self-pay

## 2024-04-22 DIAGNOSIS — R911 Solitary pulmonary nodule: Secondary | ICD-10-CM

## 2024-04-22 NOTE — Telephone Encounter (Signed)
 Results reviewed by Ruthell, NP. Call patient and review recent Lung CT results. Pt will need a 6 month follow up scan. Place order. Results and plan to PCP.     IMPRESSION: Lung-RADS 2, benign appearance or behavior. Continue annual screening with low-dose chest CT without contrast in 12 months.   Aortic atherosclerosis (ICD10-I70.0), coronary artery atherosclerosis and emphysema (ICD10-J43.9).

## 2024-04-22 NOTE — Telephone Encounter (Signed)
 Reviewed results with patient by phone, using two patient identifiers.  Atherosclerosis and emphysema noted. Patient states she is on statin medication.  Probably liver cyst noted with no follow up imaging recommended at this time.  Lung nodules noted and reviewed as LR2 but pulmonary provider is recommending 6 months follow due to no previous imaging on the 8mm nodule. This is just as precaution to ensure stability of the nodule.  Patient in agreement and had no further questions  Order placed for 6 months follow up LDCT and results/ plan faxed to PCP.

## 2024-06-04 ENCOUNTER — Encounter: Payer: Self-pay | Admitting: Dermatology

## 2024-06-04 ENCOUNTER — Ambulatory Visit: Admitting: Dermatology

## 2024-06-04 DIAGNOSIS — W908XXA Exposure to other nonionizing radiation, initial encounter: Secondary | ICD-10-CM

## 2024-06-04 DIAGNOSIS — L570 Actinic keratosis: Secondary | ICD-10-CM | POA: Diagnosis not present

## 2024-06-04 DIAGNOSIS — L821 Other seborrheic keratosis: Secondary | ICD-10-CM

## 2024-06-04 DIAGNOSIS — Z1283 Encounter for screening for malignant neoplasm of skin: Secondary | ICD-10-CM | POA: Diagnosis not present

## 2024-06-04 DIAGNOSIS — L719 Rosacea, unspecified: Secondary | ICD-10-CM

## 2024-06-04 DIAGNOSIS — L84 Corns and callosities: Secondary | ICD-10-CM

## 2024-06-04 DIAGNOSIS — D229 Melanocytic nevi, unspecified: Secondary | ICD-10-CM

## 2024-06-04 DIAGNOSIS — L814 Other melanin hyperpigmentation: Secondary | ICD-10-CM

## 2024-06-04 DIAGNOSIS — D1801 Hemangioma of skin and subcutaneous tissue: Secondary | ICD-10-CM

## 2024-06-04 DIAGNOSIS — L578 Other skin changes due to chronic exposure to nonionizing radiation: Secondary | ICD-10-CM

## 2024-06-04 DIAGNOSIS — Z86007 Personal history of in-situ neoplasm of skin: Secondary | ICD-10-CM

## 2024-06-04 MED ORDER — METRONIDAZOLE 0.75 % EX CREA: TOPICAL_CREAM | Freq: Every day | CUTANEOUS | 3 refills | Status: AC

## 2024-06-04 NOTE — Patient Instructions (Signed)

## 2024-06-04 NOTE — Progress Notes (Signed)
 Follow-Up Visit   Subjective  Mariah Ali is a 68 y.o. female who presents for the following: Skin Cancer Screening and Full Body Skin Exam  The patient presents for Total-Body Skin Exam (TBSE) for skin cancer screening and mole check. The patient has spots, moles and lesions to be evaluated, some may be new or changing and the patient may have concern these could be cancer.  Hx SCCis.  The following portions of the chart were reviewed this encounter and updated as appropriate: medications, allergies, medical history  Review of Systems:  No other skin or systemic complaints except as noted in HPI or Assessment and Plan.  Objective  Well appearing patient in no apparent distress; mood and affect are within normal limits.  A full examination was performed including scalp, head, eyes, ears, nose, lips, neck, chest, axillae, abdomen, back, buttocks, bilateral upper extremities, bilateral lower extremities, hands, feet, fingers, toes, fingernails, and toenails. All findings within normal limits unless otherwise noted below.   Relevant physical exam findings are noted in the Assessment and Plan.  central hair part Erythematous thin papules/macules with gritty scale.   Assessment & Plan   SKIN CANCER SCREENING PERFORMED TODAY.  ACTINIC DAMAGE - Chronic condition, secondary to cumulative UV/sun exposure - diffuse scaly erythematous macules with underlying dyspigmentation - Recommend daily broad spectrum sunscreen SPF 30+ to sun-exposed areas, reapply every 2 hours as needed.  - Staying in the shade or wearing long sleeves, sun glasses (UVA+UVB protection) and wide brim hats (4-inch brim around the entire circumference of the hat) are also recommended for sun protection.  - Call for new or changing lesions.  LENTIGINES, SEBORRHEIC KERATOSES, HEMANGIOMAS - Benign normal skin lesions - Benign-appearing - Call for any changes  MELANOCYTIC NEVI - Tan-brown and/or pink-flesh-colored  symmetric macules and papules - Benign appearing on exam today - Observation - Call clinic for new or changing moles - Recommend daily use of broad spectrum spf 30+ sunscreen to sun-exposed areas.   HISTORY OF SQUAMOUS CELL CARCINOMA IN SITU OF THE SKIN - No evidence of recurrence today - right dorsal wrist. EDC 05/2023 - Recommend regular full body skin exams - Recommend daily broad spectrum sunscreen SPF 30+ to sun-exposed areas, reapply every 2 hours as needed.  - Call if any new or changing lesions are noted between office visits   CORN Exam: keratotic plaque on left 5th MTP plantar surface  Treatment Plan: Recommend OTC WartStick or salicylic acid daily and pare with nail file when opaque  ROSACEA Exam Mid face erythema with telangiectasias +/- scattered inflammatory papules  Chronic and persistent condition with duration or expected duration over one year. Condition is symptomatic / bothersome to patient. Not to goal.  Rosacea is a chronic progressive skin condition usually affecting the face of adults, causing redness and/or acne bumps. It is treatable but not curable. It sometimes affects the eyes (ocular rosacea) as well. It may respond to topical and/or systemic medication and can flare with stress, sun exposure, alcohol, exercise, topical steroids (including hydrocortisone/cortisone 10) and some foods.  Daily application of broad spectrum spf 30+ sunscreen to face is recommended to reduce flares.  Treatment Plan Apply metronidazole 0.75% cream twice a day to affected areas at face.  2 months of use before benefit. Continue for maintenance  AK (ACTINIC KERATOSIS) central hair part Actinic keratoses are precancerous spots that appear secondary to cumulative UV radiation exposure/sun exposure over time. They are chronic with expected duration over 1 year. A portion of  actinic keratoses will progress to squamous cell carcinoma of the skin. It is not possible to reliably predict  which spots will progress to skin cancer and so treatment is recommended to prevent development of skin cancer.  Recommend daily broad spectrum sunscreen SPF 30+ to sun-exposed areas, reapply every 2 hours as needed.  Recommend staying in the shade or wearing long sleeves, sun glasses (UVA+UVB protection) and wide brim hats (4-inch brim around the entire circumference of the hat). Call for new or changing lesions.  Treatment deferred today, will monitor for change. ROSACEA   Related Medications metroNIDAZOLE (METROCREAM) 0.75 % cream Apply topically daily. Apply 1 gr to face twice daily MULTIPLE BENIGN NEVI   LENTIGINES   ACTINIC ELASTOSIS   SEBORRHEIC KERATOSES   CHERRY ANGIOMA   CORN OR CALLUS   Return in about 6 months (around 12/03/2024) for TBSE, with Dr. Claudene, HxSCCis.  LILLETTE Lonell Drones, RMA, am acting as scribe for Boneta Claudene, MD .   Documentation: I have reviewed the above documentation for accuracy and completeness, and I agree with the above.  Boneta Claudene, MD

## 2024-12-03 ENCOUNTER — Encounter: Admitting: Dermatology
